# Patient Record
Sex: Male | Born: 1985 | Race: Black or African American | Hispanic: No | Marital: Single | Smoking: Former smoker
Health system: Southern US, Community
[De-identification: ages and names within clinical notes are randomized; demographics above are authoritative.]

---
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
## 2003-08-17 ENCOUNTER — Emergency Department (HOSPITAL_COMMUNITY): Admission: EM | Admit: 2003-08-17 | Discharge: 2003-08-18 | Payer: Self-pay | Admitting: Emergency Medicine

## 2003-08-18 ENCOUNTER — Encounter: Payer: Self-pay | Admitting: Emergency Medicine

## 2015-02-22 ENCOUNTER — Emergency Department (HOSPITAL_COMMUNITY): Payer: Self-pay

## 2015-02-22 ENCOUNTER — Encounter (HOSPITAL_COMMUNITY): Payer: Self-pay | Admitting: Emergency Medicine

## 2015-02-22 ENCOUNTER — Emergency Department (HOSPITAL_COMMUNITY)
Admission: EM | Admit: 2015-02-22 | Discharge: 2015-02-22 | Disposition: A | Discharge status: Home / Self Care | Payer: Self-pay | Attending: Emergency Medicine | Admitting: Emergency Medicine

## 2015-02-22 DIAGNOSIS — Z87891 Personal history of nicotine dependence: Secondary | ICD-10-CM | POA: Insufficient documentation

## 2015-02-22 DIAGNOSIS — R109 Unspecified abdominal pain: Secondary | ICD-10-CM | POA: Insufficient documentation

## 2015-02-22 DIAGNOSIS — M549 Dorsalgia, unspecified: Secondary | ICD-10-CM | POA: Insufficient documentation

## 2015-02-22 LAB — COMPREHENSIVE METABOLIC PANEL
ALT: 23 U/L (ref 0–53)
AST: 28 U/L (ref 0–37)
Albumin: 4.9 g/dL (ref 3.5–5.2)
Alkaline Phosphatase: 40 U/L (ref 39–117)
Anion gap: 17 — ABNORMAL HIGH (ref 5–15)
BUN: 17 mg/dL (ref 6–23)
CHLORIDE: 108 mmol/L (ref 96–112)
CO2: 19 mmol/L (ref 19–32)
Calcium: 9.7 mg/dL (ref 8.4–10.5)
Creatinine, Ser: 1.21 mg/dL (ref 0.50–1.35)
GFR calc Af Amer: >90 mL/min (ref >90)
GFR, EST NON AFRICAN AMERICAN: 79 mL/min — AB (ref >90)
GLUCOSE: 94 mg/dL (ref 70–99)
POTASSIUM: 3.8 mmol/L (ref 3.5–5.1)
SODIUM: 144 mmol/L (ref 135–145)
TOTAL PROTEIN: 7.8 g/dL (ref 6.0–8.3)
Total Bilirubin: 1.5 mg/dL — ABNORMAL HIGH (ref 0.3–1.2)

## 2015-02-22 LAB — CBC WITH DIFFERENTIAL/PLATELET
Basophils Absolute: 0 10*3/uL (ref 0.0–0.1)
Basophils Relative: 0 % (ref 0–1)
EOS ABS: 0 10*3/uL (ref 0.0–0.7)
Eosinophils Relative: 0 % (ref 0–5)
HEMATOCRIT: 45 % (ref 39.0–52.0)
Hemoglobin: 15.3 g/dL (ref 13.0–17.0)
Lymphocytes Relative: 13 % (ref 12–46)
Lymphs Abs: 1.7 10*3/uL (ref 0.7–4.0)
MCH: 31.8 pg (ref 26.0–34.0)
MCHC: 34 g/dL (ref 30.0–36.0)
MCV: 93.6 fL (ref 78.0–100.0)
MONOS PCT: 4 % (ref 3–12)
Monocytes Absolute: 0.5 10*3/uL (ref 0.1–1.0)
NEUTROS ABS: 10.3 10*3/uL — AB (ref 1.7–7.7)
NEUTROS PCT: 83 % — AB (ref 43–77)
PLATELETS: 272 10*3/uL (ref 150–400)
RBC: 4.81 MIL/uL (ref 4.22–5.81)
RDW: 12.7 % (ref 11.5–15.5)
WBC: 12.4 10*3/uL — ABNORMAL HIGH (ref 4.0–10.5)

## 2015-02-22 LAB — URINALYSIS, ROUTINE W REFLEX MICROSCOPIC
BILIRUBIN URINE: NEGATIVE
Glucose, UA: NEGATIVE mg/dL
Hgb urine dipstick: NEGATIVE
Ketones, ur: >80 mg/dL — AB
Leukocytes, UA: NEGATIVE
Nitrite: NEGATIVE
Protein, ur: 30 mg/dL — AB
Specific Gravity, Urine: 1.03 (ref 1.005–1.030)
UROBILINOGEN UA: 0.2 mg/dL (ref 0.0–1.0)
pH: 6.5 (ref 5.0–8.0)

## 2015-02-22 LAB — URINE MICROSCOPIC-ADD ON

## 2015-02-22 LAB — I-STAT CG4 LACTIC ACID, ED
Lactic Acid, Venous: 3.53 mmol/L (ref 0.5–2.0)
Lactic Acid, Venous: 1.44 mmol/L (ref 0.5–2.0)

## 2015-02-22 LAB — LIPASE, BLOOD: LIPASE: 24 U/L (ref 11–59)

## 2015-02-22 MED ORDER — DEXTROSE 5 % IV SOLN
1.0000 g | Freq: Once | INTRAVENOUS | Status: AC
  Administered 2015-02-22: 1 g via INTRAVENOUS
  Filled 2015-02-22: qty 10

## 2015-02-22 MED ORDER — HYDROMORPHONE HCL 1 MG/ML IJ SOLN
1.0000 mg | Freq: Once | INTRAMUSCULAR | Status: AC
  Administered 2015-02-22: 1 mg via INTRAVENOUS
  Filled 2015-02-22: qty 1

## 2015-02-22 MED ORDER — CIPROFLOXACIN HCL 500 MG PO TABS: 500.0000 mg | ORAL_TABLET | Freq: Two times a day (BID) | ORAL | Status: AC

## 2015-02-22 MED ORDER — SODIUM CHLORIDE 0.9 % IV BOLUS (SEPSIS)
1000.0000 mL | Freq: Once | INTRAVENOUS | Status: AC
  Administered 2015-02-22: 1000 mL via INTRAVENOUS

## 2015-02-22 MED ORDER — ONDANSETRON HCL 4 MG/2ML IJ SOLN
4.0000 mg | Freq: Once | INTRAMUSCULAR | Status: AC
  Administered 2015-02-22: 4 mg via INTRAVENOUS
  Filled 2015-02-22: qty 2

## 2015-02-22 MED ORDER — PROMETHAZINE HCL 25 MG PO TABS: 25.0000 mg | ORAL_TABLET | Freq: Three times a day (TID) | ORAL | Status: AC | PRN

## 2015-02-22 NOTE — ED Notes (Signed)
Patient transported to CT 

## 2015-02-22 NOTE — ED Notes (Signed)
Pt presents to the department with RLQ abdominal pain radiating to right flank pain. Pt reports sudden onset pain that woke him up from his sleep. Pt reports one episode of vomiting as well. Pt unable to sit still.

## 2015-02-22 NOTE — ED Notes (Signed)
PA at BS.  

## 2015-02-22 NOTE — ED Notes (Signed)
Family at bedside. 

## 2015-02-22 NOTE — ED Provider Notes (Signed)
CSN: [id number]     Arrival date & time 02/22/15  0612 History   First MD Initiated Contact with Patient 02/22/15 [phone]     Chief Complaint  Patient presents with  . Abdominal Pain  . Back Pain     (Consider location/radiation/quality/duration/timing/severity/associated sxs/prior Treatment) HPI Patient presents to the emergency department with sudden onset of mid abdominal pain that started this morning around 3 AM the patient awoke with sudden onset of abdominal pain, nausea, vomiting.  Patient states that the pain seems to go through to his back.  Patient denies chest pain, shortness breath, weakness, dizziness, headache, blurred vision, neck pain, fever, cough, runny nose, sore throat, rash, lightheadedness, dysuria, hematuria, bloody stool or incontinence.  The patient states that nothing seems make the condition better, but palpation makes the pain worse.  He did not take any medications prior to arrival History reviewed. No pertinent past medical history. History reviewed. No pertinent past surgical history. No family history on file. History  Substance Use Topics  . Smoking status: Former Games developer  . Smokeless tobacco: Not on file  . Alcohol Use: Yes    Review of Systems All other systems negative except as documented in the HPI. All pertinent positives and negatives as reviewed in the HPI.   Allergies  Sulfa antibiotics  Home Medications   Prior to Admission medications   Not on File   BP 138/77 mmHg  Pulse 64  Temp(Src) 99.1 F (37.3 C) (Oral)  Resp 19  Ht  (1.854 m)  Wt 240 lb (108.863 kg)  BMI 31.67 kg/m2  SpO2 99% Physical Exam  Constitutional: He is oriented to person, place, and time. He appears well-developed and well-nourished. No distress.  HENT:  Head: Normocephalic and atraumatic.  Mouth/Throat: Oropharynx is clear and moist.  Eyes: Pupils are equal, round, and reactive to light.  Neck: Normal range of motion. Neck supple.  Cardiovascular:  Normal rate, regular rhythm and normal heart sounds.  Exam reveals no gallop and no friction rub.   No murmur heard. Pulmonary/Chest: Effort normal and breath sounds normal. No respiratory distress.  Abdominal: Soft. Normal appearance and bowel sounds are normal. He exhibits no distension. There is tenderness. There is no rebound and no guarding. No hernia.    Neurological: He is alert and oriented to person, place, and time. He exhibits normal muscle tone. Coordination normal.  Skin: Skin is warm and dry. No rash noted. No erythema.  Nursing note and vitals reviewed.   ED Course  Procedures (including critical care time) Labs Review Labs Reviewed  CBC WITH DIFFERENTIAL/PLATELET - Abnormal; Notable for the following:    WBC 12.4 (*)    Neutrophils Relative % 83 (*)    Neutro Abs 10.3 (*)    All other components within normal limits  COMPREHENSIVE METABOLIC PANEL - Abnormal; Notable for the following:    Total Bilirubin 1.5 (*)    GFR calc non Af Amer 79 (*)    Anion gap 17 (*)    All other components within normal limits  URINALYSIS, ROUTINE W REFLEX MICROSCOPIC - Abnormal; Notable for the following:    Ketones, ur >80 (*)    Protein, ur 30 (*)    All other components within normal limits  URINE MICROSCOPIC-ADD ON - Abnormal; Notable for the following:    Bacteria, UA FEW (*)    All other components within normal limits  I-STAT CG4 LACTIC ACID, ED - Abnormal; Notable for the following:    Lactic  Acid, Venous 3.53 (*)    All other components within normal limits  LIPASE, BLOOD  I-STAT CG4 LACTIC ACID, ED    Imaging Review Ct Abdomen Pelvis Wo Contrast  02/22/2015   CLINICAL DATA:  Acute onset left flank and left lower quadrant pain the night of 02/21/2015. Initial encounter.  EXAM: CT ABDOMEN AND PELVIS WITHOUT CONTRAST  TECHNIQUE: Multidetector CT imaging of the abdomen and pelvis was performed following the standard protocol without IV contrast.  COMPARISON:  None.   FINDINGS: The lung bases are clear.  No pleural or pericardial effusion.  No renal or ureteral stones are identified on the right or left. The kidneys have a normal uninfused appearance. Small right adrenal gland calcifications are consistent with prior infection or hemorrhage. The left adrenal gland, spleen, pancreas, liver, gallbladder and biliary tree all appear normal. The stomach, small bowel and appendix appear normal. There is no lymphadenopathy or fluid. No focal bony abnormality is identified.  IMPRESSION: Negative for urinary tract stone. Negative CT abdomen and pelvis. No finding to explain the patient's symptoms.   Electronically Signed   By: Drusilla Kannerhomas  Dalessio M.D.   On: 02/22/2015 07:32   [place] Abdomen Complete  02/22/2015   CLINICAL DATA:  Abdominal pain, back pain  EXAM: ULTRASOUND ABDOMEN COMPLETE  COMPARISON:  None.  FINDINGS: Gallbladder: No gallstones or wall thickening visualized. No sonographic Murphy sign noted.  Common bile duct: Diameter: Normal caliber, 4 mm  Liver: No focal lesion identified. Within normal limits in parenchymal echogenicity.  IVC: No abnormality visualized.  Pancreas: Visualized portion unremarkable.  Spleen: Size and appearance within normal limits.  Right Kidney: Length: 11.0 cm. Increased echotexture. No hydronephrosis or focal abnormality.  Left Kidney: Length: 11.0 cm. Echogenicity within normal limits. No mass or hydronephrosis visualized.  Abdominal aorta: No aneurysm visualized.  Other findings: None.  IMPRESSION: No cholelithiasis or acute cholecystitis.  Diffusely increased echotexture throughout the right kidney without hydronephrosis. This is nonspecific. Recommend clinical correlation to exclude pyelonephritis.   Electronically Signed   By: Charlett NoseKevin  Dover M.D.   On: 02/22/2015 11:17     EKG Interpretation   Date/Time:  Thursday February 22 2015 06:20:59 EDT Ventricular Rate:  63 PR Interval:  166 QRS Duration: 94 QT Interval:  392 QTC Calculation: 401 R  Axis:   94 Text Interpretation:  Sinus rhythm Atrial premature complex Borderline  right axis deviation Left ventricular hypertrophy with repolarization  abnormality No old tracing to compare Confirmed by [facility] PsychiatricGLICK  MD, DAVID  ([id number]) on 02/22/2015 6:27:49 AM       Patient is feeling completely resolution of symptoms this time.  He has been observed in the emergency department for multiple hours.  His lab testing does not show any significant abnormalities.  Patient will be treated for possible mild urinary tract issue, but do not feel this is the cause of his symptoms.  Told to return here as needed.  Told to follow up with primary care doctor   Charlestine NightChristopher Hoda Hon, PA-C 02/23/15 1659  Tilden FossaElizabeth Rees, MD 02/23/15 [phone]

## 2015-02-22 NOTE — ED Notes (Signed)
Lab results reported to Dr. Rees 

## 2015-02-24 LAB — URINE CULTURE
Colony Count: NO GROWTH
Culture: NO GROWTH

## 2016-08-29 IMAGING — CT CT ABD-PELV W/O CM
2 of 4 series · 17 of 46 positions shown, 19 images · non-contrast
Comparison: None.

CLINICAL DATA: Acute onset left flank and left lower quadrant pain
the night of 02/21/2015. Initial encounter.

EXAM:
CT ABDOMEN AND PELVIS WITHOUT CONTRAST
TECHNIQUE: Multidetector CT imaging of the abdomen and pelvis was performed
following the standard protocol without IV contrast.

[Series 2: stone study 5.0 i30f 1 · axial · 0.82mm/px · z∈[-624,-184]mm · 14 of 96 slices shown, 16 images]
[im 4/96  soft-tissue]
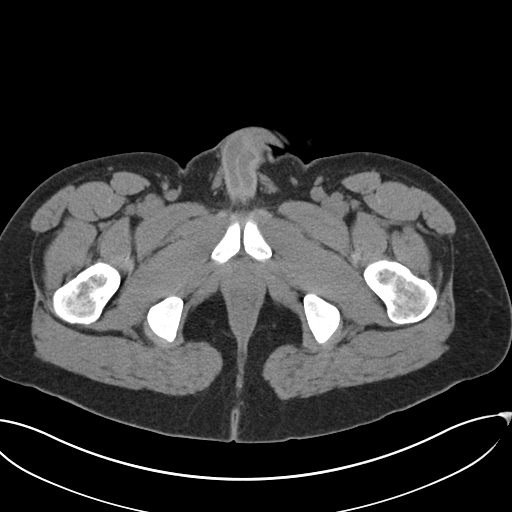
[im 4/96  bone]
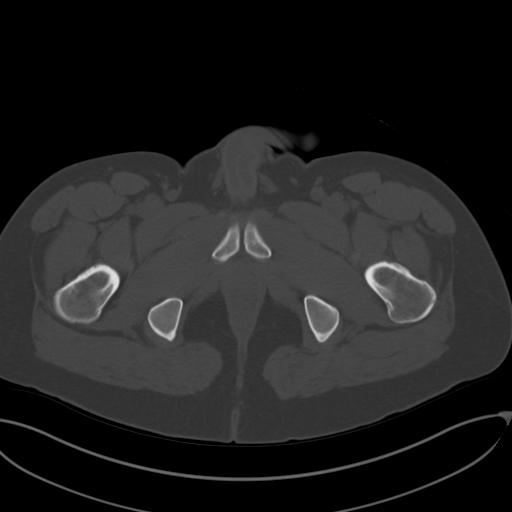
[im 12/96  soft-tissue]
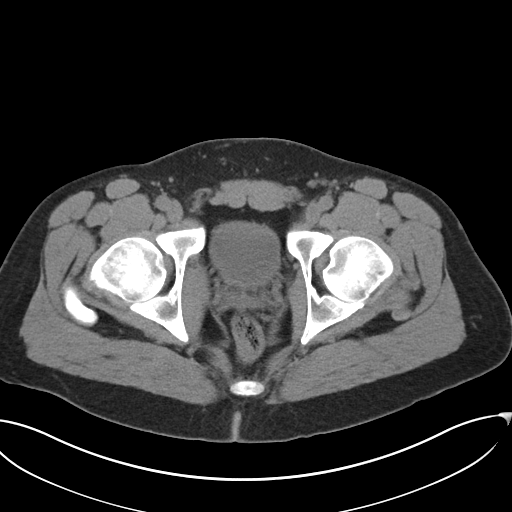
[im 20/96  soft-tissue]
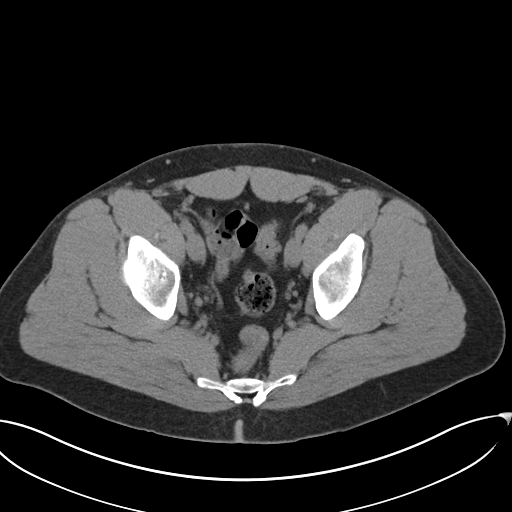
[im 24/96  soft-tissue]
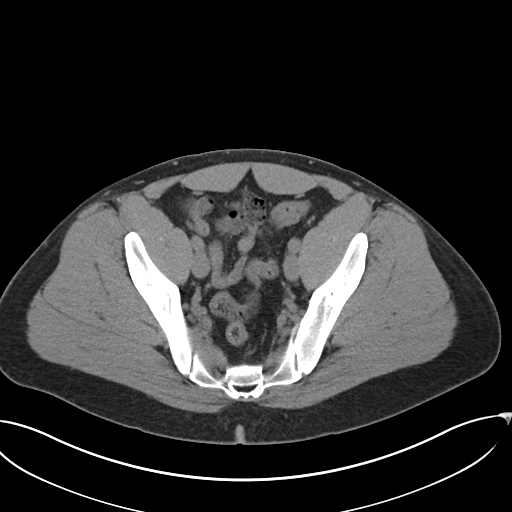
[im 32/96  soft-tissue]
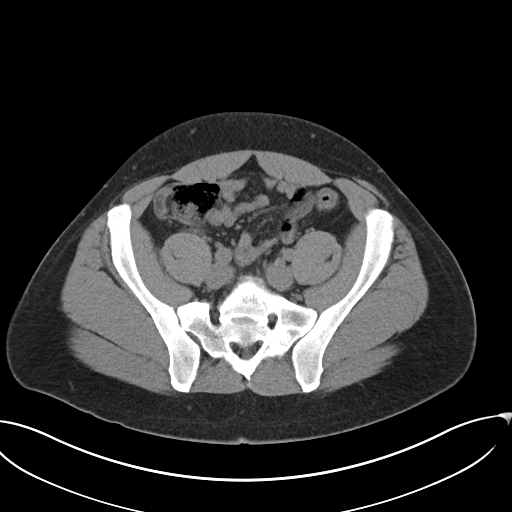
[im 40/96  soft-tissue]
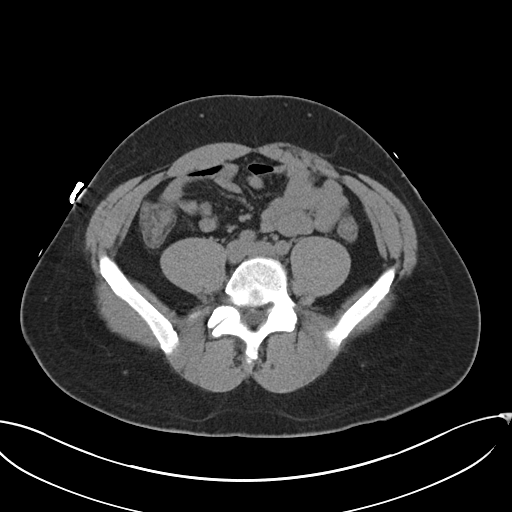
[im 44/96  soft-tissue]
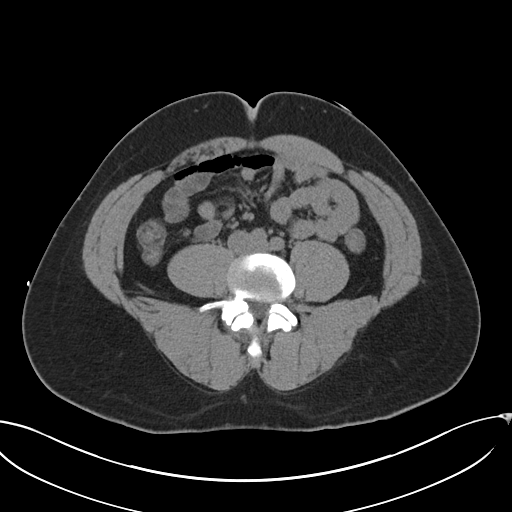
[im 52/96  soft-tissue]
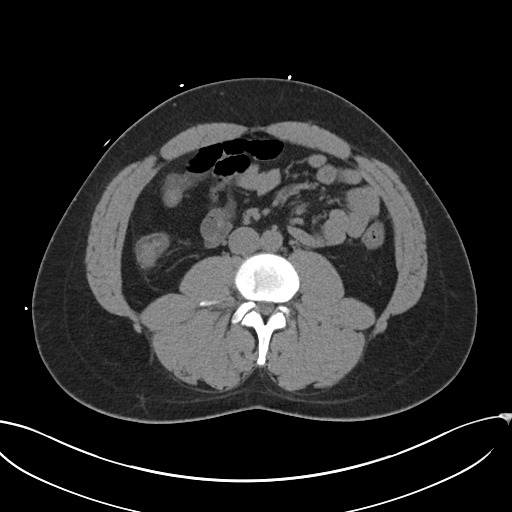
[im 56/96  soft-tissue]
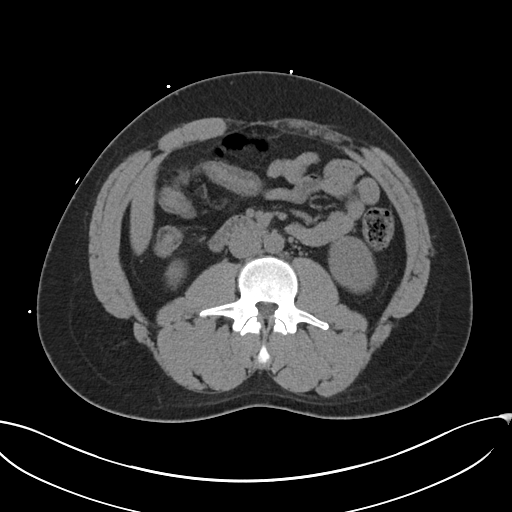
[im 56/96  bone]
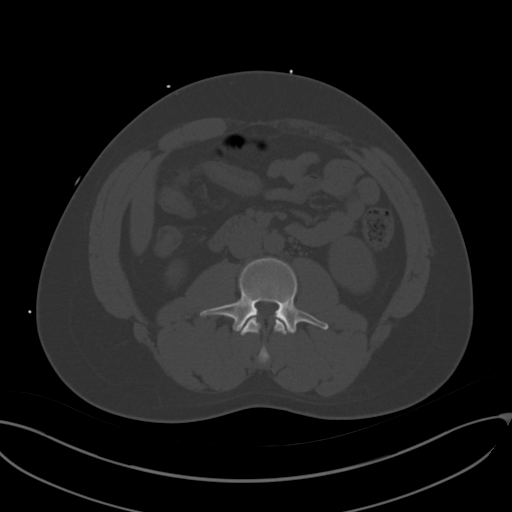
[im 64/96  soft-tissue]
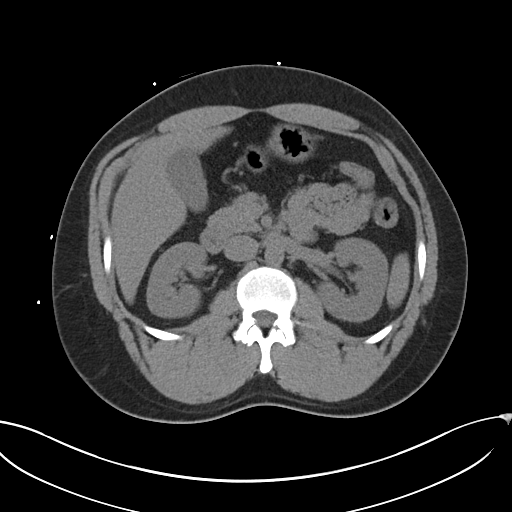
[im 72/96  soft-tissue]
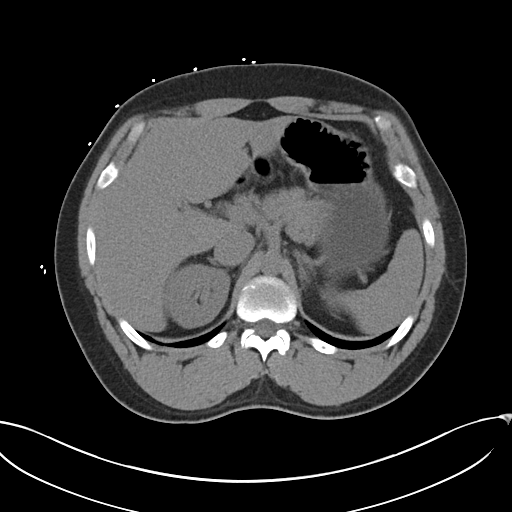
[im 76/96  soft-tissue]
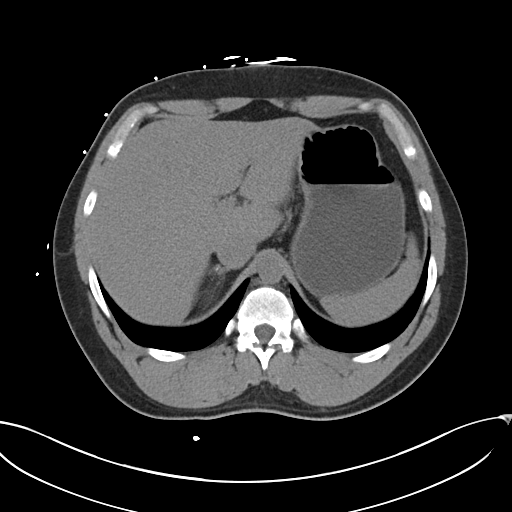
[im 84/96  soft-tissue]
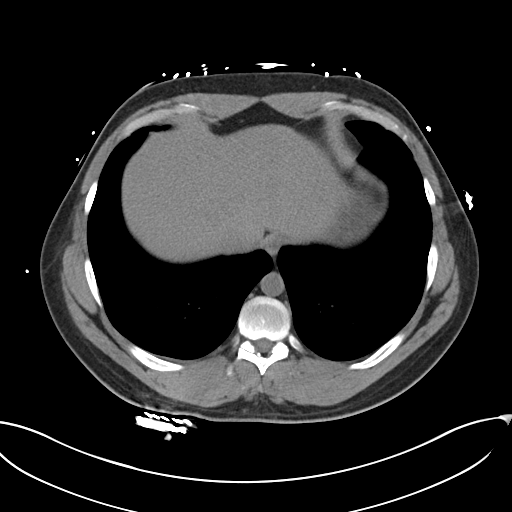
[im 92/96  soft-tissue]
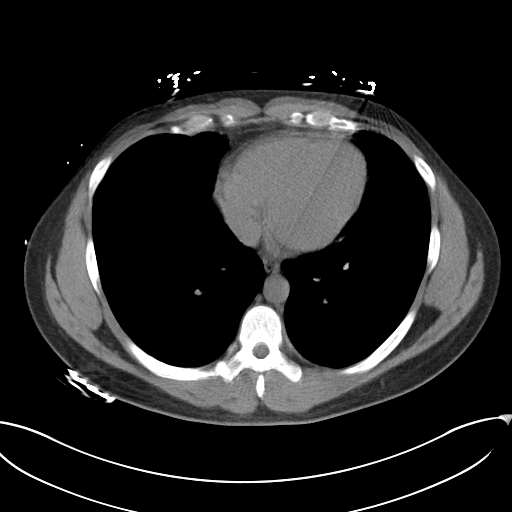

[Series 5: coronal soft tissue · coronal · 0.92mm/px · 3 of 101 slices shown]
[im 34/101  soft-tissue]
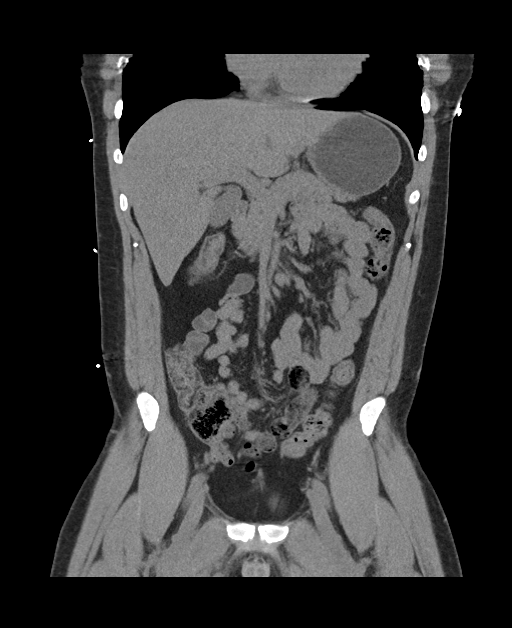
[im 45/101  soft-tissue]
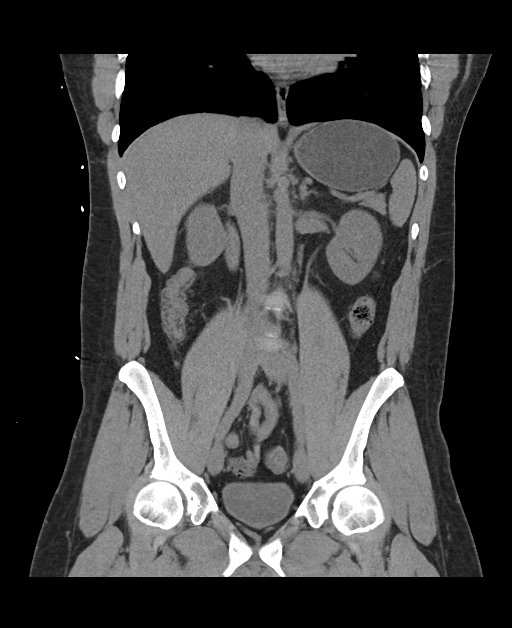
[im 56/101  soft-tissue]
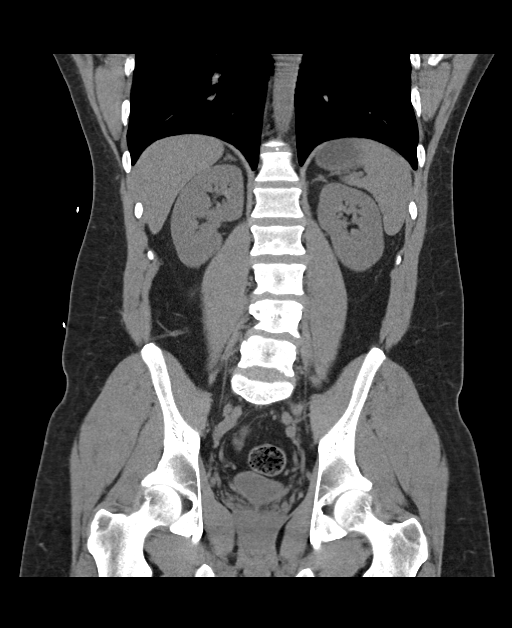

[17 of 46 positions shown; findings below may reference images not displayed]

FINDINGS: The lung bases are clear.  No pleural or pericardial effusion.

No renal or ureteral stones are identified on the right or left. The
kidneys have a normal uninfused appearance. Small right adrenal
gland calcifications are consistent with prior infection or
hemorrhage. The left adrenal gland, spleen, pancreas, liver,
gallbladder and biliary tree all appear normal. The stomach, small
bowel and appendix appear normal. There is no lymphadenopathy or
fluid. No focal bony abnormality is identified.
IMPRESSION: Negative for urinary tract stone. Negative CT abdomen and pelvis. No
finding to explain the patient's symptoms.

## 2016-08-29 IMAGING — US US ABDOMEN COMPLETE
1 series · 14 of 25 positions shown · non-contrast
Comparison: None.

CLINICAL DATA: Abdominal pain, back pain

EXAM:
ULTRASOUND ABDOMEN COMPLETE

[Series 1: us abdomen complete · 0.21mm/px · 14 of 58 slices shown]
[im 1/58]
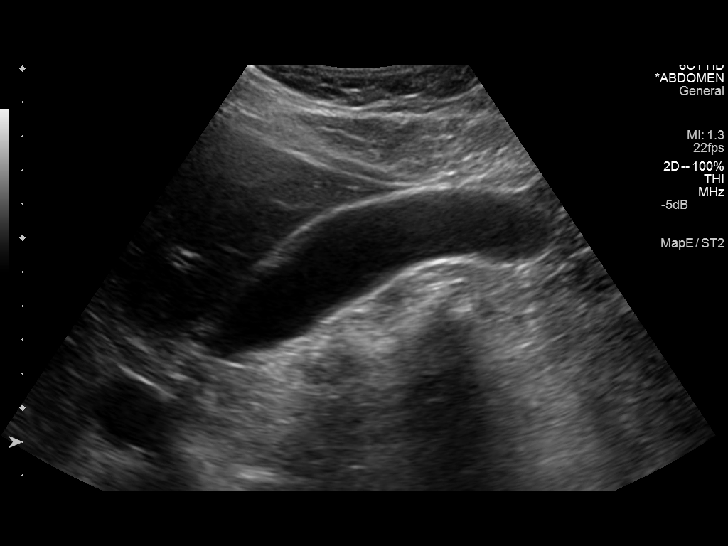
[im 5/58]
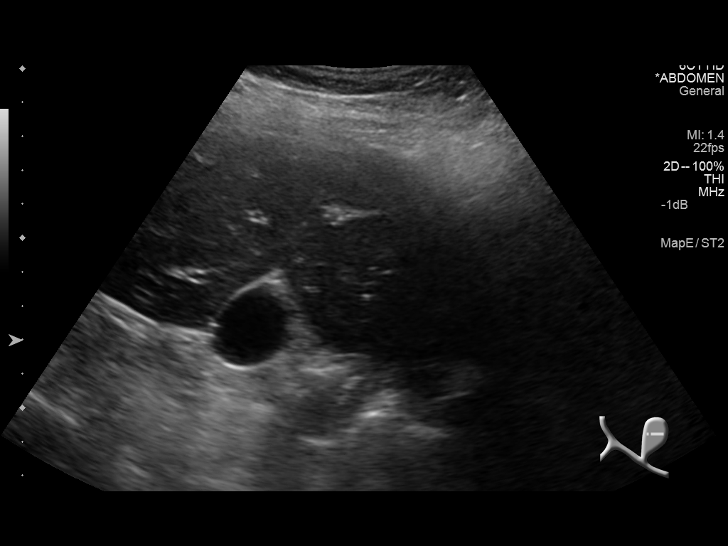
[im 10/58]
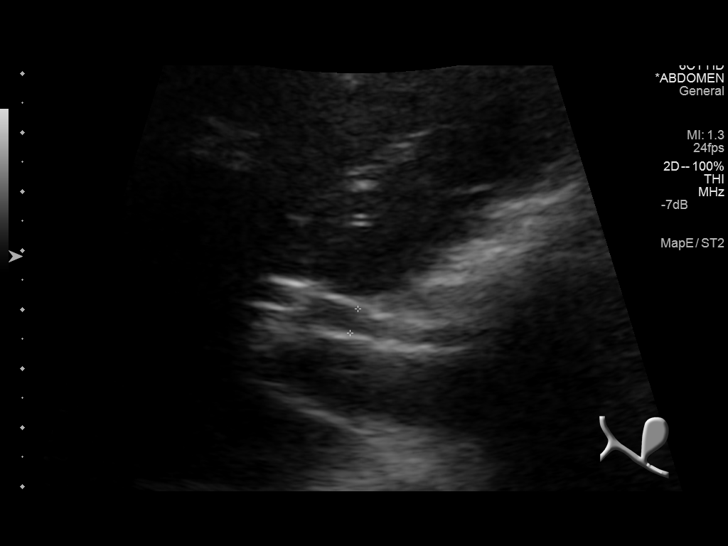
[im 15/58]
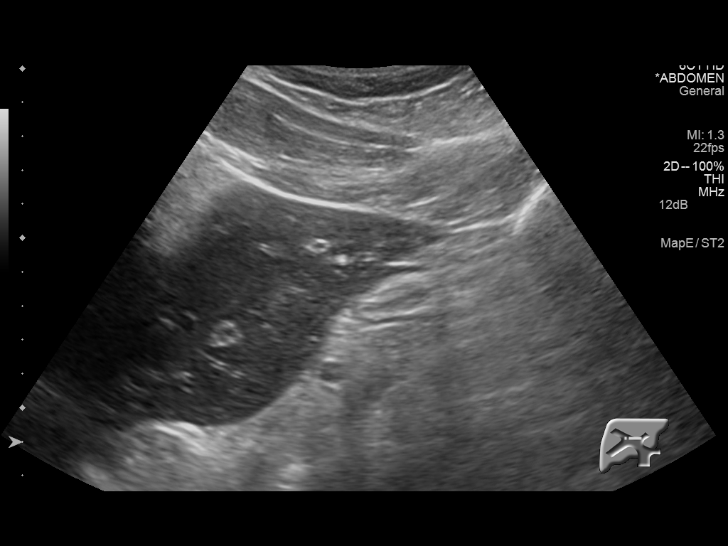
[im 20/58]
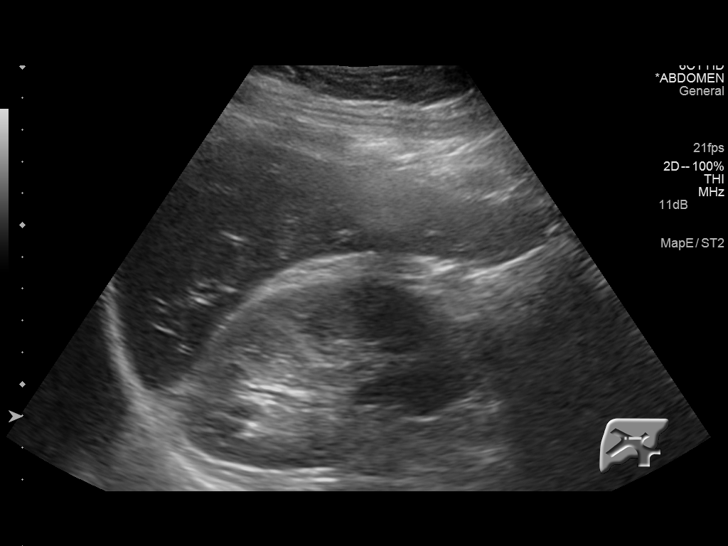
[im 22/58]
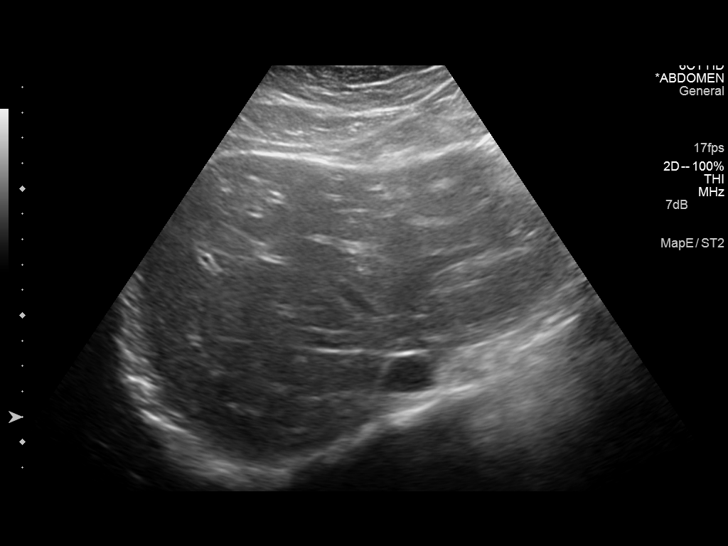
[im 27/58]
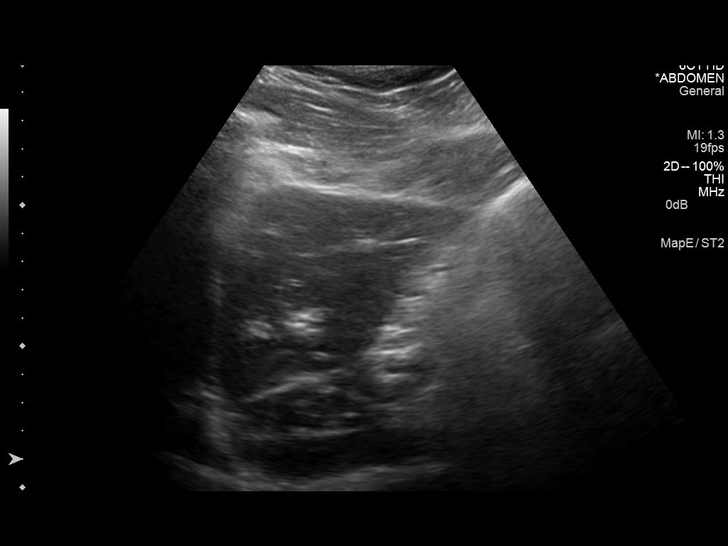
[im 31/58]
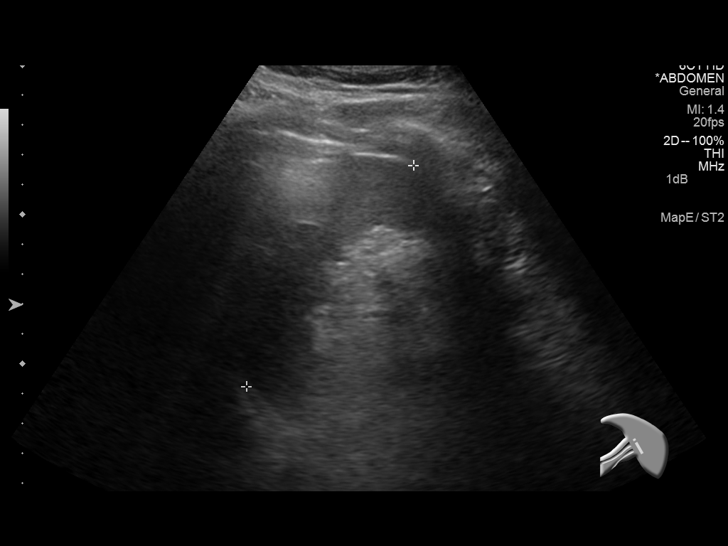
[im 36/58]
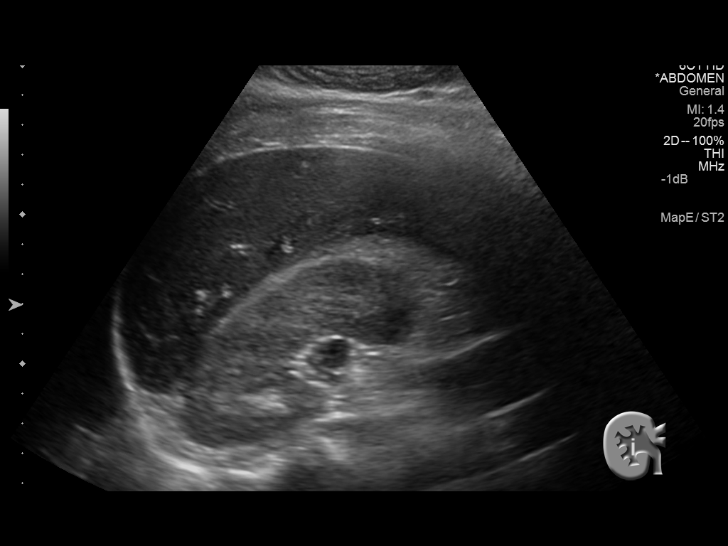
[im 39/58]
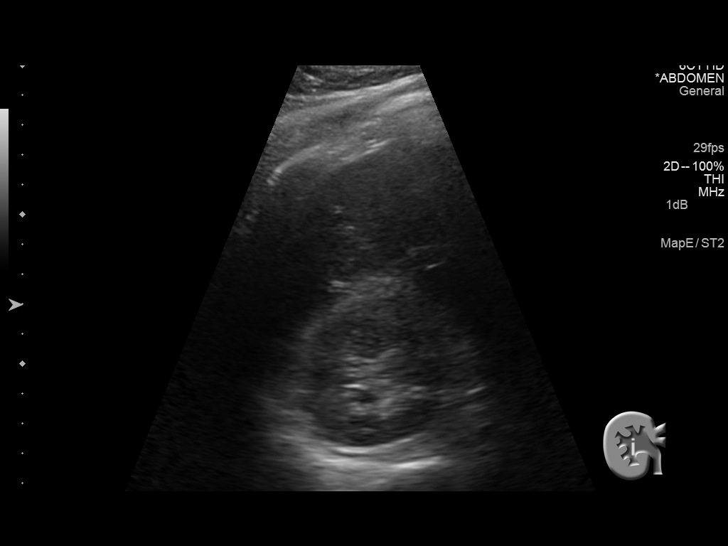
[im 43/58]
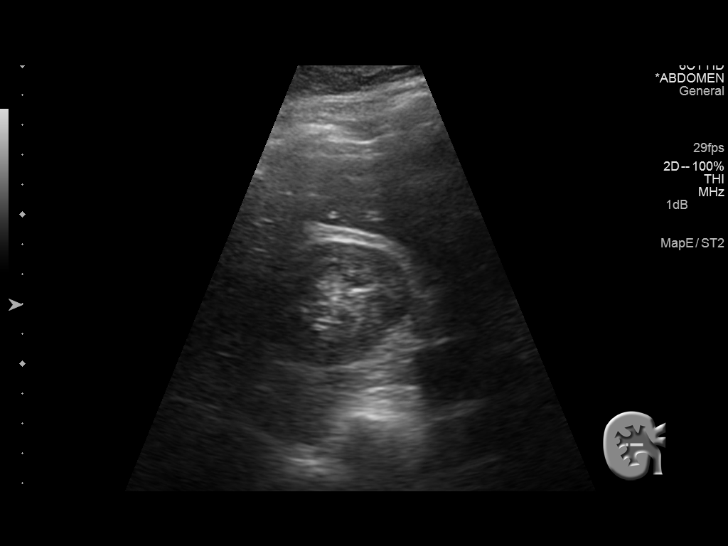
[im 48/58]
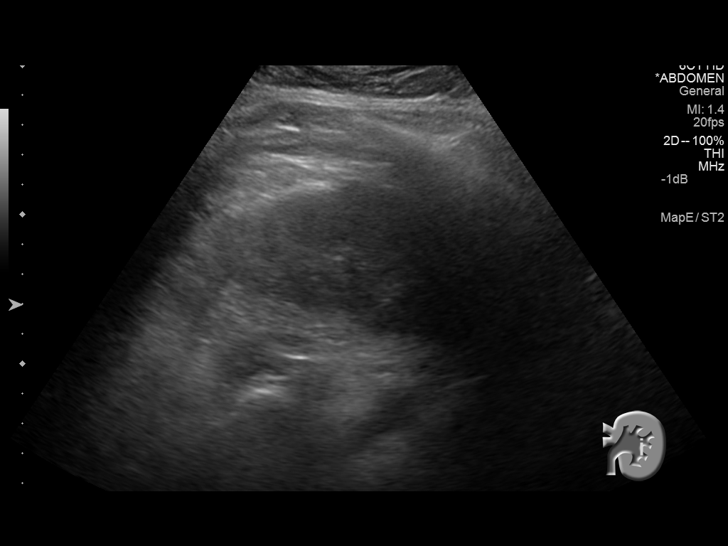
[im 53/58]
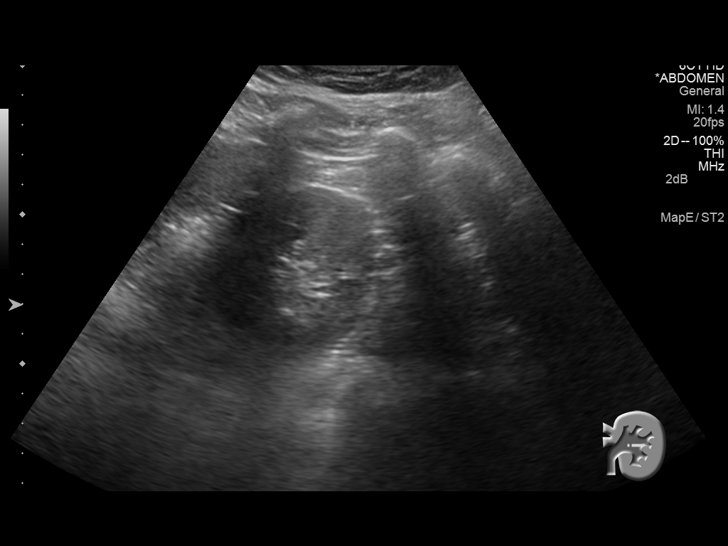
[im 58/58]
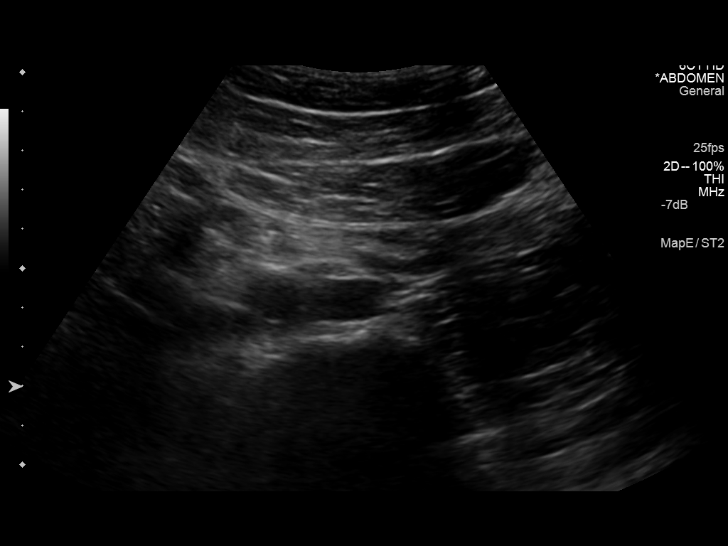

[14 of 25 positions shown; findings below may reference images not displayed]

FINDINGS: Gallbladder: No gallstones or wall thickening visualized. No
sonographic Murphy sign noted.

Common bile duct: Diameter: Normal caliber, 4 mm

Liver: No focal lesion identified. Within normal limits in
parenchymal echogenicity.

IVC: No abnormality visualized.

Pancreas: Visualized portion unremarkable.

Spleen: Size and appearance within normal limits.

Right Kidney: Length: 11.0 cm. Increased echotexture. No
hydronephrosis or focal abnormality.

Left Kidney: Length: 11.0 cm. Echogenicity within normal limits. No
mass or hydronephrosis visualized.

Abdominal aorta: No aneurysm visualized.

Other findings: None.
IMPRESSION: No cholelithiasis or acute cholecystitis.

Diffusely increased echotexture throughout the right kidney without
hydronephrosis. This is nonspecific. Recommend clinical correlation
to exclude pyelonephritis.
